# Patient Record
Sex: Male | Born: 1990 | Race: Black or African American | Hispanic: No | Marital: Single | Smoking: Former smoker
Health system: Southern US, Community
[De-identification: ages and names within clinical notes are randomized; demographics above are authoritative.]

---
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
## 2019-09-20 ENCOUNTER — Encounter (HOSPITAL_COMMUNITY): Payer: Self-pay | Admitting: *Deleted

## 2019-09-20 ENCOUNTER — Emergency Department (HOSPITAL_COMMUNITY)
Admission: EM | Admit: 2019-09-20 | Discharge: 2019-09-20 | Disposition: A | Discharge status: Home / Self Care | Payer: No Typology Code available for payment source | Attending: Emergency Medicine | Admitting: Emergency Medicine

## 2019-09-20 ENCOUNTER — Emergency Department (HOSPITAL_COMMUNITY): Payer: No Typology Code available for payment source

## 2019-09-20 ENCOUNTER — Other Ambulatory Visit: Payer: Self-pay

## 2019-09-20 DIAGNOSIS — Y92410 Unspecified street and highway as the place of occurrence of the external cause: Secondary | ICD-10-CM | POA: Insufficient documentation

## 2019-09-20 DIAGNOSIS — Y9389 Activity, other specified: Secondary | ICD-10-CM | POA: Insufficient documentation

## 2019-09-20 DIAGNOSIS — Z87891 Personal history of nicotine dependence: Secondary | ICD-10-CM | POA: Insufficient documentation

## 2019-09-20 DIAGNOSIS — R079 Chest pain, unspecified: Secondary | ICD-10-CM | POA: Diagnosis not present

## 2019-09-20 DIAGNOSIS — M25512 Pain in left shoulder: Secondary | ICD-10-CM | POA: Diagnosis not present
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
## 2021-05-16 ENCOUNTER — Other Ambulatory Visit: Payer: Self-pay

## 2021-05-16 ENCOUNTER — Encounter (HOSPITAL_COMMUNITY): Payer: Self-pay | Admitting: *Deleted

## 2021-05-16 ENCOUNTER — Emergency Department (HOSPITAL_COMMUNITY)
Admission: EM | Admit: 2021-05-16 | Discharge: 2021-05-16 | Discharge status: Against Medical Advice | Payer: Self-pay | Attending: Emergency Medicine | Admitting: Emergency Medicine

## 2021-05-16 DIAGNOSIS — R519 Headache, unspecified: Secondary | ICD-10-CM | POA: Insufficient documentation

## 2021-05-16 DIAGNOSIS — R0981 Nasal congestion: Secondary | ICD-10-CM | POA: Insufficient documentation

## 2021-05-16 DIAGNOSIS — Z5321 Procedure and treatment not carried out due to patient leaving prior to being seen by health care provider: Secondary | ICD-10-CM | POA: Insufficient documentation

## 2021-05-16 NOTE — ED Triage Notes (Signed)
Pt with facial pain with bending over.  Nasal congestion. Ongoing for 2-3 years.  Loss of smell for " long time" ?

## 2022-04-30 ENCOUNTER — Other Ambulatory Visit: Payer: Self-pay

## 2022-04-30 ENCOUNTER — Encounter (HOSPITAL_COMMUNITY): Payer: Self-pay | Admitting: Emergency Medicine

## 2022-04-30 ENCOUNTER — Emergency Department (HOSPITAL_COMMUNITY)
Admission: EM | Admit: 2022-04-30 | Discharge: 2022-04-30 | Disposition: A | Discharge status: Home / Self Care | Payer: Self-pay | Attending: Emergency Medicine | Admitting: Emergency Medicine

## 2022-04-30 DIAGNOSIS — J0111 Acute recurrent frontal sinusitis: Secondary | ICD-10-CM | POA: Insufficient documentation

## 2022-04-30 MED ORDER — AMOXICILLIN-POT CLAVULANATE 875-125 MG PO TABS: 1.0000 | ORAL_TABLET | Freq: Two times a day (BID) | ORAL | 0 refills | Status: AC

## 2022-04-30 MED ORDER — CETIRIZINE-PSEUDOEPHEDRINE ER 5-120 MG PO TB12: 1.0000 | ORAL_TABLET | Freq: Every day | ORAL | 0 refills | Status: AC | PRN

## 2022-04-30 NOTE — ED Triage Notes (Signed)
Headache, sinus pressure, and nasal congestion x 2 months. Worse over past 3 days. Denies fever

## 2022-04-30 NOTE — Discharge Instructions (Signed)
Take the entire course of the antibiotics..  You may use the allergy medicine as needed which can help you breath more clearly through your nose (it helps shrink the lining of the nose and sinus's).  I suspect you may have a polyp in your left nostril that can be causing your chronic symptoms and you may benefit from seeing an Ear, Nose and Throat specialist if it continues to cause you problems.  Consider calling Dr. Suszanne Conners for office evaluation.

## 2022-04-30 NOTE — ED Provider Notes (Signed)
[place] EMERGENCY DEPARTMENT AT [facility] Provider Note   CSN: [id number] Arrival date & time: 04/30/22  8295     History  Chief Complaint  Patient presents with   Nasal Congestion    s   Facial Pain   Headache    Roy Ramsey is a 32 y.o. male presenting for evaluation of suspected sinus infection.  He describes bilateral forehead pain, nasal congestion and increased post nasal drip of thick mucus.  He has not been able to clear or blow his nose. He endorses generalized headache,  denies fever.  Has had similar sx last year which improved with abx but states has had chronic nasal congestion for at least the past 2 months.  He has had no medications nor has he found any alleviators. He denies sneezing, cough, itchy or watery eyes.  The history is provided by the patient.       Home Medications Prior to Admission medications   Medication Sig Start Date End Date Taking? Authorizing Provider  amoxicillin-clavulanate (AUGMENTIN) 875-125 MG tablet Take 1 tablet by mouth every 12 (twelve) hours for 10 days. 04/30/22 05/10/22 Yes Javohn Basey, Raynelle Fanning, PA-C  cetirizine-pseudoephedrine (ZYRTEC-D) 5-120 MG tablet Take 1 tablet by mouth daily as needed for allergies. 04/30/22  Yes IdolRaynelle Fanning, PA-C      Allergies    Patient has no known allergies.    Review of Systems   Review of Systems  Constitutional:  Negative for chills and fever.  HENT:  Positive for congestion, postnasal drip and sinus pain. Negative for ear discharge, ear pain, facial swelling, sinus pressure, sore throat, tinnitus, trouble swallowing and voice change.   Eyes:  Negative for discharge.  Respiratory:  Negative for cough, shortness of breath, wheezing and stridor.   Cardiovascular:  Negative for chest pain.  Gastrointestinal:  Negative for abdominal pain.  Genitourinary: Negative.   Neurological:  Positive for headaches.  All other systems reviewed and are negative.   Physical Exam Updated Vital  Signs BP 135/83 (BP Location: Left Arm)   Pulse 68   Temp 98.4 F (36.9 C) (Oral)   Resp 18   Ht 6\' 1"  (1.854 m)   Wt 81.6 kg   SpO2 98%   BMI 23.75 kg/m  Physical Exam Constitutional:      Appearance: He is well-developed.  HENT:     Head: Normocephalic and atraumatic.     Right Ear: Ear canal normal. No middle ear effusion. Tympanic membrane is bulging.     Left Ear: Ear canal normal.  No middle ear effusion. Tympanic membrane is bulging.     Ears:     Comments: Bilateral tm's milky white with loss of landmarks, right >left. No effusion, no erythema.    Nose: Mucosal edema and congestion present. No rhinorrhea.     Left Nostril: Occlusion present.     Right Sinus: Frontal sinus tenderness present.     Left Sinus: Frontal sinus tenderness present.     Comments: Left nostril completely occluded - edematous turbinate vs polyp, mucosa erythematous.    Mouth/Throat:     Mouth: Mucous membranes are moist.     Pharynx: Oropharynx is clear. Uvula midline. No oropharyngeal exudate or posterior oropharyngeal erythema.     Tonsils: No tonsillar abscesses.  Eyes:     Conjunctiva/sclera: Conjunctivae normal.  Cardiovascular:     Rate and Rhythm: Normal rate.  Pulmonary:     Effort: Pulmonary effort is normal. No respiratory distress.  Musculoskeletal:  General: Normal range of motion.  Skin:    General: Skin is warm and dry.     Findings: No rash.  Neurological:     Mental Status: He is alert and oriented to person, place, and time.     ED Results / Procedures / Treatments   Labs (all labs ordered are listed, but only abnormal results are displayed) Labs Reviewed - No data to display  EKG None  Radiology No results found.  Procedures Procedures    Medications Ordered in ED Medications - No data to display  ED Course/ Medical Decision Making/ A&P                             Medical Decision Making Pt with acute on possible chronic sinusitis sx,  ttp  bilateral frontal sinus's, will cover with abx,  also recommended zyrtec d and discussed other home tx,  steam/ saline spray.  Given chronicity of sx and questionable polyp left nostril,  ENT referral given for prn f/u.   Risk Prescription drug management.           Final Clinical Impression(s) / ED Diagnoses Final diagnoses:  Acute recurrent frontal sinusitis    Rx / DC Orders ED Discharge Orders          Ordered    amoxicillin-clavulanate (AUGMENTIN) 875-125 MG tablet  Every 12 hours        04/30/22 0948    cetirizine-pseudoephedrine (ZYRTEC-D) 5-120 MG tablet  Daily PRN        04/30/22 0948              Burgess Amor, PA-C 04/30/22 1017    Eber Hong, MD 05/03/22 [phone]
